# Patient Record
Sex: Female | Born: 1945 | Race: White | Hispanic: No | Marital: Married | Smoking: Never smoker
Health system: Southern US, Community
[De-identification: ages and names within clinical notes are randomized; demographics above are authoritative.]

---

## 2012-12-03 DIAGNOSIS — C4491 Basal cell carcinoma of skin, unspecified: Secondary | ICD-10-CM | POA: Insufficient documentation

## 2012-12-03 DIAGNOSIS — J309 Allergic rhinitis, unspecified: Secondary | ICD-10-CM | POA: Insufficient documentation

## 2013-07-01 DIAGNOSIS — H269 Unspecified cataract: Secondary | ICD-10-CM | POA: Insufficient documentation

## 2014-03-01 DIAGNOSIS — J4520 Mild intermittent asthma, uncomplicated: Secondary | ICD-10-CM | POA: Insufficient documentation

## 2014-07-26 DIAGNOSIS — Z86010 Personal history of colonic polyps: Secondary | ICD-10-CM | POA: Insufficient documentation

## 2016-09-19 DIAGNOSIS — L659 Nonscarring hair loss, unspecified: Secondary | ICD-10-CM | POA: Insufficient documentation

## 2016-11-19 DIAGNOSIS — H35033 Hypertensive retinopathy, bilateral: Secondary | ICD-10-CM | POA: Insufficient documentation

## 2016-11-20 DIAGNOSIS — M545 Low back pain, unspecified: Secondary | ICD-10-CM | POA: Insufficient documentation

## 2016-11-20 DIAGNOSIS — R7989 Other specified abnormal findings of blood chemistry: Secondary | ICD-10-CM | POA: Insufficient documentation

## 2017-02-11 ENCOUNTER — Encounter: Payer: Self-pay | Admitting: Family Medicine

## 2017-02-11 ENCOUNTER — Ambulatory Visit (INDEPENDENT_AMBULATORY_CARE_PROVIDER_SITE_OTHER): Payer: Medicare Other | Admitting: Family Medicine

## 2017-02-11 DIAGNOSIS — M79605 Pain in left leg: Secondary | ICD-10-CM | POA: Diagnosis present

## 2017-02-11 NOTE — Patient Instructions (Signed)
This is consistent with a peripheral neuropathy or a pinched nerve in your back causing symptoms into your left leg. We will go ahead with nerve conduction studies and I will call you to go over the results. Follow up and treatment will depend on that test.

## 2017-02-12 DIAGNOSIS — M79605 Pain in left leg: Secondary | ICD-10-CM | POA: Insufficient documentation

## 2017-02-12 NOTE — Assessment & Plan Note (Signed)
exam is reassuring.  No evidence issue with knee.  Consistent either with lumbar radiculopathy or peripheral neuropathy.  Will go ahead with NCVs/EMGs to further assess.  Tylenol, ibuprofen if needed.

## 2017-02-12 NOTE — Progress Notes (Addendum)
PCP: Javier Glazier, MD  Subjective:   HPI: Patient is a 71 y.o. female here for left leg pain.  Patient reports she's had about 2 years of pain in left leg. No acute injury or trauma. She describes pain and soreness with tingling in left lat thigh across front of knee to medial lower leg. Worse with uneven ground. Better with exercising. Worse with stairs also. Associated numbness in same distribution. Has had back pain as well. No skin changes. No bowel/bladder dysfunction. Pain level 0/10 currently.  No past medical history on file.  No current outpatient prescriptions on file prior to visit.   No current facility-administered medications on file prior to visit.     No past surgical history on file.  No Known Allergies  Social History   Social History  . Marital status: Married    Spouse name: N/A  . Number of children: N/A  . Years of education: N/A   Occupational History  . Not on file.   Social History Main Topics  . Smoking status: Never Smoker  . Smokeless tobacco: Never Used  . Alcohol use Not on file  . Drug use: Unknown  . Sexual activity: Not on file   Other Topics Concern  . Not on file   Social History Narrative  . No narrative on file    No family history on file.  BP (!) 145/98   Pulse 68   Ht 5\' 5"  (1.651 m)   Wt 166 lb (75.3 kg)   BMI 27.62 kg/m   Review of Systems: See HPI above.     Objective:  Physical Exam:  Gen: NAD, comfortable in exam room  Back/left leg: No gross deformity, scoliosis. No TTP .  No midline or bony TTP. FROM. Strength LEs 5/5 all muscle groups.   2+ MSRs in patellar and achilles tendons, equal bilaterally. Negative SLRs. Sensation intact to light touch bilaterally. Negative logroll bilateral hips Negative fabers and piriformis stretches.   Left knee: No gross deformity, ecchymoses, swelling. No TTP. FROM. Negative ant/post drawers. Negative valgus/varus testing. Negative  lachmanns. Negative mcmurrays, apleys, patellar apprehension. NV intact distally.  Assessment & Plan:  1. Left leg pain - exam is reassuring.  No evidence issue with knee.  Consistent either with lumbar radiculopathy or peripheral neuropathy.  Will go ahead with NCVs/EMGs to further assess.  Tylenol, ibuprofen if needed.    Addendum:  NCV/EMGs reviewed and discussed with patient - test was normal.  Will go ahead with MRI of lumbar spine as next step.  If this doesn't show cause of neuropathic pain would consider nortriptyline, lyrica, or gabapentin.  Addendum:  MRI reviewed and discussed with patient.  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  Discussed options - will go ahead with ESI at this level - patient will call me a week after this to let me know how she's doing.

## 2017-02-13 NOTE — Addendum Note (Signed)
Addended by: Sherrie George F on: 02/13/2017 11:43 AM   Modules accepted: Orders

## 2017-03-03 ENCOUNTER — Ambulatory Visit (INDEPENDENT_AMBULATORY_CARE_PROVIDER_SITE_OTHER): Payer: Self-pay | Admitting: Neurology

## 2017-03-03 ENCOUNTER — Encounter: Payer: Self-pay | Admitting: Neurology

## 2017-03-03 ENCOUNTER — Ambulatory Visit (INDEPENDENT_AMBULATORY_CARE_PROVIDER_SITE_OTHER): Payer: Medicare Other | Admitting: Neurology

## 2017-03-03 DIAGNOSIS — M79605 Pain in left leg: Secondary | ICD-10-CM

## 2017-03-03 NOTE — Progress Notes (Signed)
Please refer to EMG and nerve conduction study procedure note. 

## 2017-03-03 NOTE — Procedures (Signed)
     HISTORY:  Colleen Hardin is a 71 year old patient with a two-year history of discomfort in the lateral aspect of the left thigh, with some tingly sensations going into the medial aspect of the left lower extremity. She denies any back pain or any weakness of the left leg. She feels better with activity and exercise, worse when she is resting. She is being evaluated for a possible neuropathy or a lumbosacral radiculopathy.  NERVE CONDUCTION STUDIES:  Nerve conduction studies were performed on the left lower extremity. The distal motor latencies and motor amplitudes for the peroneal and posterior tibial nerves were within normal limits with normal nerve conduction velocities seen for these nerves. The sensory latency for the left peroneal nerve was normal, and the H reflex latencies were normal on the right but slightly prolonged on the left.  EMG STUDIES:  EMG study was performed on the left lower extremity:  The tibialis anterior muscle reveals 2 to 4K motor units with full recruitment. No fibrillations or positive waves were seen. The peroneus tertius muscle reveals 2 to 4K motor units with full recruitment. No fibrillations or positive waves were seen. The medial gastrocnemius muscle reveals 1 to 3K motor units with full recruitment. No fibrillations or positive waves were seen. The vastus lateralis muscle reveals 2 to 4K motor units with full recruitment. No fibrillations or positive waves were seen. The iliopsoas muscle reveals 2 to 4K motor units with full recruitment. No fibrillations or positive waves were seen. The biceps femoris muscle (long head) reveals 2 to 4K motor units with full recruitment. No fibrillations or positive waves were seen. The lumbosacral paraspinal muscles were tested at 3 levels, and revealed no abnormalities of insertional activity at all 3 levels tested. There was good relaxation.   IMPRESSION:  Nerve conduction studies done on the left lower extremity  were relatively unremarkable without evidence of a peripheral neuropathy. EMG evaluation of the left lower extremity was normal without evidence of an overlying lumbosacral radiculopathy.  Jill Alexanders MD 03/03/2017 4:02 PM  [facility] [address] [place], [place] [address]  Phone [phone] Fax [phone]

## 2017-03-07 NOTE — Progress Notes (Signed)
    [place]    Nerve / Sites Muscle Latency Ref. Amplitude Ref. Rel Amp Segments Distance Velocity Ref. Area    ms ms mV mV %  cm m/s m/s mVms  L Peroneal - EDB     Ankle EDB 3.4 ?6.5 2.3 ?2.0 100 Ankle - EDB 9   6.4     Fib head EDB 11.8  1.4  62.1 Fib head - Ankle 38 46 ?44 4.5     Pop fossa EDB 13.8  1.4  101 Pop fossa - Fib head 10 51 ?44 5.2         Pop fossa - Ankle      L Tibial - AH     Ankle AH 3.8 ?5.8 6.8 ?4.0 100 Ankle - AH 9   17.2     Pop fossa AH 13.4  3.5  52.2 Pop fossa - Ankle 44 46 ?41 13.0         SNC    Nerve / Sites Rec. Site Peak Lat Ref.  Amp Ref. Segments Distance    ms ms V V  cm  L Superficial peroneal - Ankle     Lat leg Ankle 3.2 ?4.4 5 ?6 Lat leg - Ankle 14       H Reflex    Nerve H Lat Lat Hmax   ms ms   Left Right Ref. Left Right Ref.  Tibial - [place] 35.9 33.7 ?35.0 35.9 19.8 ?35.0         EMG

## 2017-04-01 NOTE — Addendum Note (Signed)
Addended by: Sherrie George F on: 04/01/2017 01:54 PM   Modules accepted: Orders

## 2017-04-12 ENCOUNTER — Ambulatory Visit (HOSPITAL_BASED_OUTPATIENT_CLINIC_OR_DEPARTMENT_OTHER)
Admission: RE | Admit: 2017-04-12 | Discharge: 2017-04-12 | Disposition: A | Discharge status: Home / Self Care | Payer: Medicare Other | Source: Ambulatory Visit | Attending: Family Medicine | Admitting: Family Medicine

## 2017-04-12 DIAGNOSIS — M5126 Other intervertebral disc displacement, lumbar region: Secondary | ICD-10-CM | POA: Diagnosis not present

## 2017-04-12 DIAGNOSIS — M4316 Spondylolisthesis, lumbar region: Secondary | ICD-10-CM | POA: Diagnosis not present

## 2017-04-12 DIAGNOSIS — M79605 Pain in left leg: Secondary | ICD-10-CM | POA: Diagnosis present

## 2017-04-12 DIAGNOSIS — M48061 Spinal stenosis, lumbar region without neurogenic claudication: Secondary | ICD-10-CM | POA: Insufficient documentation

## 2017-04-18 ENCOUNTER — Other Ambulatory Visit: Payer: Self-pay | Admitting: Family Medicine

## 2017-04-18 DIAGNOSIS — M545 Low back pain, unspecified: Secondary | ICD-10-CM

## 2017-04-18 DIAGNOSIS — G8929 Other chronic pain: Secondary | ICD-10-CM

## 2017-05-05 ENCOUNTER — Ambulatory Visit
Admission: RE | Admit: 2017-05-05 | Discharge: 2017-05-05 | Disposition: A | Discharge status: Home / Self Care | Payer: Medicare Other | Source: Ambulatory Visit | Attending: Family Medicine | Admitting: Family Medicine

## 2017-05-05 ENCOUNTER — Other Ambulatory Visit: Payer: Self-pay | Admitting: Family Medicine

## 2017-05-05 DIAGNOSIS — M545 Low back pain, unspecified: Secondary | ICD-10-CM

## 2017-05-05 DIAGNOSIS — G8929 Other chronic pain: Secondary | ICD-10-CM

## 2017-05-05 MED ORDER — IOPAMIDOL (ISOVUE-M 200) INJECTION 41%
1.0000 mL | Freq: Once | INTRAMUSCULAR | Status: AC
  Administered 2017-05-05: 1 mL via EPIDURAL

## 2017-05-05 MED ORDER — METHYLPREDNISOLONE ACETATE 40 MG/ML INJ SUSP (RADIOLOG
120.0000 mg | Freq: Once | INTRAMUSCULAR | Status: AC
  Administered 2017-05-05: 120 mg via EPIDURAL

## 2017-05-05 NOTE — Discharge Instructions (Signed)

## 2017-05-16 ENCOUNTER — Telehealth: Payer: Self-pay | Admitting: Family Medicine

## 2017-05-16 NOTE — Telephone Encounter (Signed)
Great to hear, thanks!

## 2017-05-16 NOTE — Telephone Encounter (Signed)
Patient calling regarding ESI on 10/8. States she has had no pain during any activities that she has participated in, only some tingling sensations. Patient said she is pleased with outcome

## 2017-08-04 ENCOUNTER — Encounter: Payer: Self-pay | Admitting: Family Medicine

## 2017-08-04 ENCOUNTER — Ambulatory Visit (INDEPENDENT_AMBULATORY_CARE_PROVIDER_SITE_OTHER): Payer: Medicare Other | Admitting: Family Medicine

## 2017-08-04 DIAGNOSIS — M79605 Pain in left leg: Secondary | ICD-10-CM

## 2017-08-04 NOTE — Patient Instructions (Signed)
We will go ahead with a repeat injection of your back. About a week after this start physical therapy at [facility] (also let me know how you're doing via mychart or call us). Follow up with me in 5-6 weeks if you're doing well.

## 2017-08-05 ENCOUNTER — Other Ambulatory Visit: Payer: Self-pay | Admitting: Family Medicine

## 2017-08-05 DIAGNOSIS — M5416 Radiculopathy, lumbar region: Secondary | ICD-10-CM

## 2017-08-06 ENCOUNTER — Encounter: Payer: Self-pay | Admitting: Family Medicine

## 2017-08-06 NOTE — Assessment & Plan Note (Signed)
consistent with flare of radiculopathy - did improve with ESI at L3-4 where she has severe foraminal narrowing from disc protrusion and facet arthropathy - relief for about 6 weeks on the left side.  We will repeat this but couple with physical therapy.  Tylenol, ibuprofen if needed.  F/u in 5-6 weeks.

## 2017-08-06 NOTE — Progress Notes (Signed)
PCP: Javier Glazier, MD  Subjective:   HPI: Patient is a 72 y.o. female here for left leg pain.  02/11/17: Patient reports she's had about 2 years of pain in left leg. No acute injury or trauma. She describes pain and soreness with tingling in left lat thigh across front of knee to medial lower leg. Worse with uneven ground. Better with exercising. Worse with stairs also. Associated numbness in same distribution. Has had back pain as well. No skin changes. No bowel/bladder dysfunction. Pain level 0/10 currently.  08/04/17: Patient reports injection for her back lasted about 6 weeks, had no pain. Then pain started creeping back since that time. Pain worse with standing, walking, and running. Pain level 3/10, sharp. Pain primarily in left leg and radiating laterally to around the knee. Associated numbness. No bowel/bladder dysfunction.  History reviewed. No pertinent past medical history.  Current Outpatient Medications on File Prior to Visit  Medication Sig Dispense Refill  . albuterol (PROVENTIL HFA;VENTOLIN HFA) 108 (90 Base) MCG/ACT inhaler Inhale into the lungs.    Marland Kitchen aspirin 81 MG chewable tablet Chew by mouth.    . fluticasone (FLONASE) 50 MCG/ACT nasal spray 1 spray by Each Nare route daily.    Marland Kitchen loratadine (CLARITIN) 10 MG tablet Take by mouth.    . Multiple Vitamin (MULTI-VITAMINS) TABS Take by mouth.     No current facility-administered medications on file prior to visit.     History reviewed. No pertinent surgical history.  No Known Allergies  Social History   Socioeconomic History  . Marital status: Married    Spouse name: Not on file  . Number of children: Not on file  . Years of education: Not on file  . Highest education level: Not on file  Social Needs  . Financial resource strain: Not on file  . Food insecurity - worry: Not on file  . Food insecurity - inability: Not on file  . Transportation needs - medical: Not on file  . Transportation  needs - non-medical: Not on file  Occupational History  . Not on file  Tobacco Use  . Smoking status: Never Smoker  . Smokeless tobacco: Never Used  Substance and Sexual Activity  . Alcohol use: Not on file  . Drug use: Not on file  . Sexual activity: Not on file  Other Topics Concern  . Not on file  Social History Narrative  . Not on file    History reviewed. No pertinent family history.  BP 129/83   Pulse 72   Ht 5\' 5"  (1.651 m)   Wt 160 lb (72.6 kg)   BMI 26.63 kg/m   Review of Systems: See HPI above.     Objective:  Physical Exam:  Gen: NAD, comfortable in exam room.  Back: No gross deformity, scoliosis. No TTP .  No midline or bony TTP. FROM. Strength LEs 5/5 all muscle groups.   2+ MSRs in patellar and achilles tendons, equal bilaterally. Mild pain with left SLR, more of a tightness. Sensation intact to light touch bilaterally.  Left hip: No deformity. No tenderness to palpation. FROM with 5/5 strength. Negative logroll. Negative fabers and piriformis.  Assessment & Plan:  1. Left leg pain - consistent with flare of radiculopathy - did improve with ESI at L3-4 where she has severe foraminal narrowing from disc protrusion and facet arthropathy - relief for about 6 weeks on the left side.  We will repeat this but couple with physical therapy.  Tylenol, ibuprofen if  needed.  F/u in 5-6 weeks.

## 2017-08-12 NOTE — Addendum Note (Signed)
Addended by: Sherrie George F on: 08/12/2017 01:29 PM   Modules accepted: Orders

## 2017-08-13 ENCOUNTER — Ambulatory Visit
Admission: RE | Admit: 2017-08-13 | Discharge: 2017-08-13 | Disposition: A | Discharge status: Home / Self Care | Payer: Medicare Other | Source: Ambulatory Visit | Attending: Family Medicine | Admitting: Family Medicine

## 2017-08-13 ENCOUNTER — Other Ambulatory Visit: Payer: Self-pay | Admitting: Family Medicine

## 2017-08-13 DIAGNOSIS — M5416 Radiculopathy, lumbar region: Secondary | ICD-10-CM

## 2017-08-13 IMAGING — XA DG EPIDURAL/NERVE ROOT
2 series · 2 of 2 positions shown · non-contrast
Comparison: none

CLINICAL DATA: Lumbosacral spondylosis without myelopathy. Left
lower extremity radicular pain. Complete relief for 6 weeks
following the first epidural injection with pain subsequently
gradually returning.

[Series 1: ortho standard · 1 of 1 slices shown (1 of 2)]
[im 1/1]
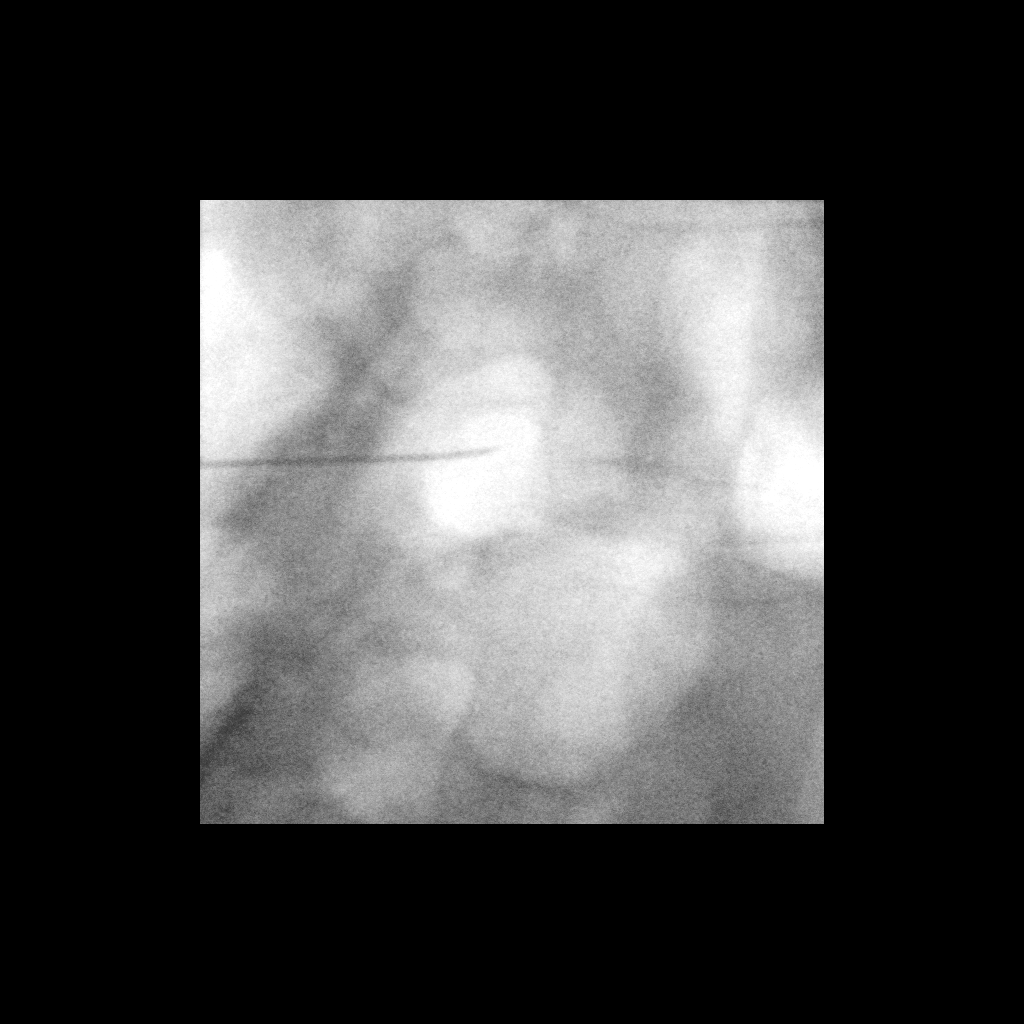

[Series 2: ortho standard · 1 of 1 slices shown (2 of 2)]
[im 1/1]
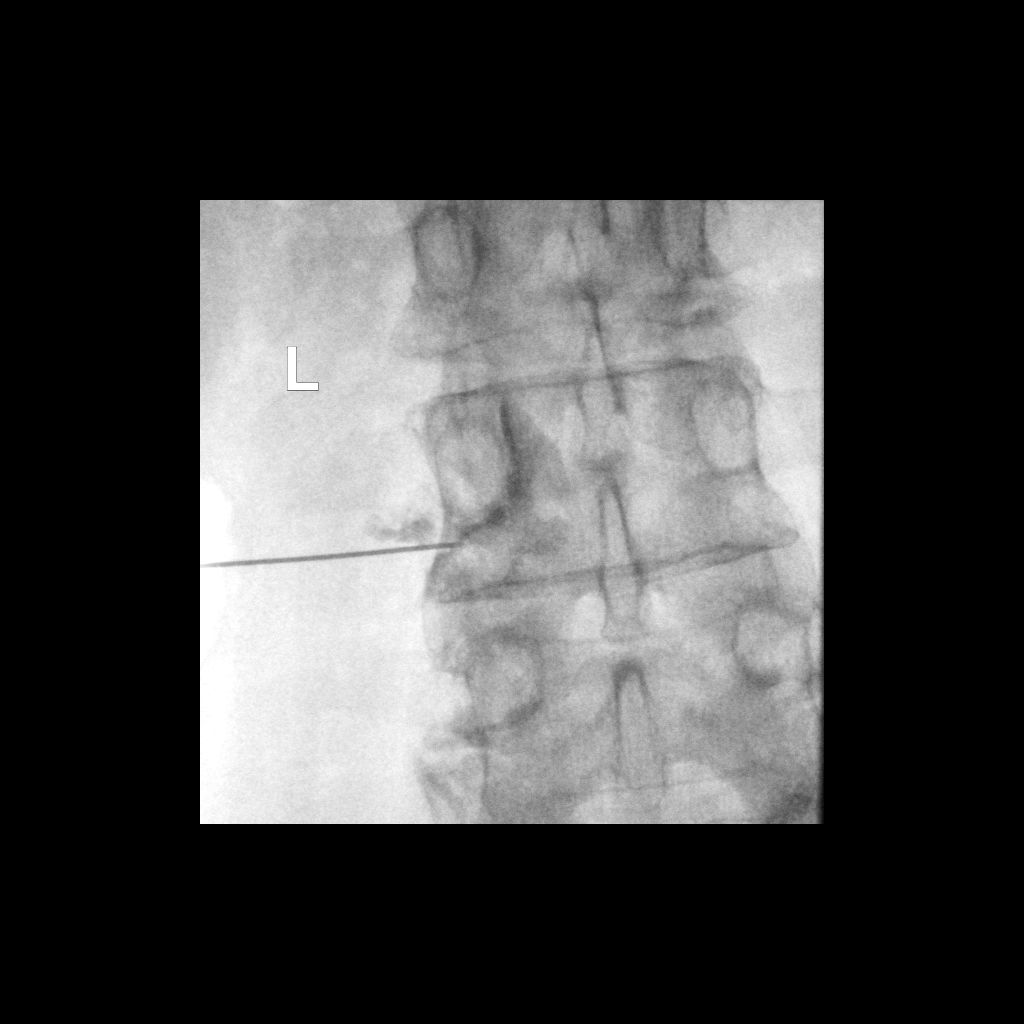

[2 of 2 positions shown; findings below may reference images not displayed]

EXAM:
EPIDURAL/NERVE ROOT

FLUOROSCOPY TIME:  Radiation Exposure Index (as provided by the
fluoroscopic device): 24.51 microGray*m^2

Fluoroscopy Time (in minutes and seconds):  10 seconds

PROCEDURE:
The procedure, risks, benefits, and alternatives were explained to
the patient. Questions regarding the procedure were encouraged and
answered. The patient understands and consents to the procedure.

LEFT L3 NERVE ROOT BLOCK AND TRANSFORAMINAL EPIDURAL: A posterior
oblique approach was taken to the intervertebral foramen on the left
at L3-4 using a curved 3.5 inch 22 gauge spinal needle. Injection of
Isovue-M 200 outlined the left L3 nerve root and showed good
epidural spread. No vascular opacification is seen. 120 the mg of
Depo-Medrol mixed with 2 mL of 1% lidocaine were instilled. The
procedure was well-tolerated, and the patient was discharged thirty
minutes following the injection in good condition.

COMPLICATIONS:
None
IMPRESSION: Technically successful injection consisting of a left L3 nerve root
block and transforaminal epidural.

## 2017-08-13 MED ORDER — IOPAMIDOL (ISOVUE-M 200) INJECTION 41%
1.0000 mL | Freq: Once | INTRAMUSCULAR | Status: AC
  Administered 2017-08-13: 1 mL via EPIDURAL

## 2017-08-13 MED ORDER — METHYLPREDNISOLONE ACETATE 40 MG/ML INJ SUSP (RADIOLOG
120.0000 mg | Freq: Once | INTRAMUSCULAR | Status: AC
  Administered 2017-08-13: 120 mg via EPIDURAL

## 2017-08-13 NOTE — Discharge Instructions (Signed)

## 2017-10-15 ENCOUNTER — Telehealth: Payer: Self-pay | Admitting: Family Medicine

## 2017-10-15 NOTE — Telephone Encounter (Signed)
Waiting to hear back from PT office to see if patient completed therapy

## 2017-10-15 NOTE — Telephone Encounter (Signed)
Ok to go ahead with repeat left L3 nerve root block, transforaminal epidural.  Did she end up doing PT at [facility] too?

## 2017-10-15 NOTE — Telephone Encounter (Signed)
Patient requesting another back injection

## 2017-10-16 ENCOUNTER — Other Ambulatory Visit: Payer: Self-pay | Admitting: Family Medicine

## 2017-10-16 DIAGNOSIS — M545 Low back pain: Secondary | ICD-10-CM

## 2017-10-29 ENCOUNTER — Ambulatory Visit
Admission: RE | Admit: 2017-10-29 | Discharge: 2017-10-29 | Disposition: A | Discharge status: Home / Self Care | Payer: Medicare Other | Source: Ambulatory Visit | Attending: Family Medicine | Admitting: Family Medicine

## 2017-10-29 DIAGNOSIS — M545 Low back pain: Secondary | ICD-10-CM

## 2017-10-29 MED ORDER — METHYLPREDNISOLONE ACETATE 40 MG/ML INJ SUSP (RADIOLOG
120.0000 mg | Freq: Once | INTRAMUSCULAR | Status: AC
  Administered 2017-10-29: 120 mg via EPIDURAL

## 2017-10-29 MED ORDER — IOPAMIDOL (ISOVUE-M 200) INJECTION 41%
1.0000 mL | Freq: Once | INTRAMUSCULAR | Status: AC
  Administered 2017-10-29: 1 mL via EPIDURAL

## 2017-10-29 NOTE — Discharge Instructions (Signed)

## 2017-10-29 NOTE — Telephone Encounter (Signed)
Patient contacted office to inform provider that she had the injection in her back today. She has not previously done physical therapy at [facility] but it ready to go ahead with that now. Requesting a new order to be sent to them.

## 2017-10-29 NOTE — Telephone Encounter (Signed)
Please go ahead with order.  Thanks!

## 2017-10-30 NOTE — Addendum Note (Signed)
Addended by: Sherrie George F on: 10/30/2017 08:51 AM   Modules accepted: Orders

## 2017-10-31 NOTE — Telephone Encounter (Signed)
Referral order sent 

## 2017-11-10 ENCOUNTER — Telehealth: Payer: Self-pay | Admitting: Family Medicine

## 2017-11-10 NOTE — Telephone Encounter (Signed)
Ok to go ahead with referral.

## 2017-11-10 NOTE — Telephone Encounter (Signed)
Patient called stating the 3rd injection did not help at all. She has found no relief. Patient requesting to be referred to either Dr. Patrice Paradise or Dr. Sherlyn Lick

## 2017-11-12 NOTE — Addendum Note (Signed)
Addended by: Sherrie George F on: 11/12/2017 01:48 PM   Modules accepted: Orders

## 2017-11-13 NOTE — Telephone Encounter (Signed)
Referral sent
